# Patient Record
Sex: Female | Born: 1975 | Race: White | Hispanic: No | Smoking: Current every day smoker
Health system: Southern US, Community
[De-identification: ages and names within clinical notes are randomized; demographics above are authoritative.]

## PROBLEM LIST (undated history)

## (undated) DIAGNOSIS — D34 Benign neoplasm of thyroid gland: Secondary | ICD-10-CM

## (undated) HISTORY — PX: URETHRA SURGERY: SHX824

---

## 2014-12-30 ENCOUNTER — Encounter (HOSPITAL_COMMUNITY): Payer: Self-pay | Admitting: Nurse Practitioner

## 2014-12-30 ENCOUNTER — Emergency Department (HOSPITAL_COMMUNITY)
Admission: EM | Admit: 2014-12-30 | Discharge: 2014-12-30 | Disposition: A | Discharge status: Home / Self Care | Payer: Self-pay | Attending: Emergency Medicine | Admitting: Emergency Medicine

## 2014-12-30 DIAGNOSIS — Z720 Tobacco use: Secondary | ICD-10-CM | POA: Insufficient documentation

## 2014-12-30 DIAGNOSIS — Z3202 Encounter for pregnancy test, result negative: Secondary | ICD-10-CM | POA: Insufficient documentation

## 2014-12-30 DIAGNOSIS — R197 Diarrhea, unspecified: Secondary | ICD-10-CM | POA: Insufficient documentation

## 2014-12-30 DIAGNOSIS — Z880 Allergy status to penicillin: Secondary | ICD-10-CM | POA: Insufficient documentation

## 2014-12-30 DIAGNOSIS — R1084 Generalized abdominal pain: Secondary | ICD-10-CM | POA: Insufficient documentation

## 2014-12-30 DIAGNOSIS — R1110 Vomiting, unspecified: Secondary | ICD-10-CM

## 2014-12-30 HISTORY — DX: Benign neoplasm of thyroid gland: D34

## 2014-12-30 LAB — URINALYSIS, ROUTINE W REFLEX MICROSCOPIC
GLUCOSE, UA: NEGATIVE mg/dL
HGB URINE DIPSTICK: NEGATIVE
Ketones, ur: 15 mg/dL — AB
Nitrite: NEGATIVE
PH: 5 (ref 5.0–8.0)
Protein, ur: NEGATIVE mg/dL
Specific Gravity, Urine: 1.021 (ref 1.005–1.030)
Urobilinogen, UA: 0.2 mg/dL (ref 0.0–1.0)

## 2014-12-30 LAB — COMPREHENSIVE METABOLIC PANEL
ALT: 26 U/L (ref 14–54)
ANION GAP: 7 (ref 5–15)
AST: 29 U/L (ref 15–41)
Albumin: 3.3 g/dL — ABNORMAL LOW (ref 3.5–5.0)
Alkaline Phosphatase: 87 U/L (ref 38–126)
CO2: 24 mmol/L (ref 22–32)
Calcium: 9.2 mg/dL (ref 8.9–10.3)
Chloride: 106 mmol/L (ref 101–111)
Creatinine, Ser: 0.67 mg/dL (ref 0.44–1.00)
GFR calc Af Amer: >60 mL/min (ref >60)
GFR calc non Af Amer: >60 mL/min (ref >60)
Glucose, Bld: 83 mg/dL (ref 65–99)
Potassium: 4.5 mmol/L (ref 3.5–5.1)
Sodium: 137 mmol/L (ref 135–145)
TOTAL PROTEIN: 7.6 g/dL (ref 6.5–8.1)
Total Bilirubin: 0.3 mg/dL (ref 0.3–1.2)

## 2014-12-30 LAB — POC URINE PREG, ED: Preg Test, Ur: NEGATIVE

## 2014-12-30 LAB — CBC WITH DIFFERENTIAL/PLATELET
Basophils Absolute: 0 10*3/uL (ref 0.0–0.1)
Basophils Relative: 0 % (ref 0–1)
EOS ABS: 0.2 10*3/uL (ref 0.0–0.7)
Eosinophils Relative: 2 % (ref 0–5)
HCT: 46.5 % — ABNORMAL HIGH (ref 36.0–46.0)
Hemoglobin: 16.3 g/dL — ABNORMAL HIGH (ref 12.0–15.0)
LYMPHS ABS: 3 10*3/uL (ref 0.7–4.0)
LYMPHS PCT: 31 % (ref 12–46)
MCH: 29.7 pg (ref 26.0–34.0)
MCHC: 35.1 g/dL (ref 30.0–36.0)
MCV: 84.9 fL (ref 78.0–100.0)
Monocytes Absolute: 0.5 10*3/uL (ref 0.1–1.0)
Monocytes Relative: 5 % (ref 3–12)
NEUTROS PCT: 62 % (ref 43–77)
Neutro Abs: 6.2 10*3/uL (ref 1.7–7.7)
PLATELETS: 252 10*3/uL (ref 150–400)
RBC: 5.48 MIL/uL — AB (ref 3.87–5.11)
RDW: 14.7 % (ref 11.5–15.5)
WBC: 9.9 10*3/uL (ref 4.0–10.5)

## 2014-12-30 LAB — URINE MICROSCOPIC-ADD ON

## 2014-12-30 MED ORDER — ONDANSETRON HCL 4 MG/2ML IJ SOLN
4.0000 mg | Freq: Once | INTRAMUSCULAR | Status: AC
  Administered 2014-12-30: 4 mg via INTRAVENOUS
  Filled 2014-12-30: qty 2

## 2014-12-30 MED ORDER — METOCLOPRAMIDE HCL 10 MG PO TABS: 10.0000 mg | ORAL_TABLET | Freq: Four times a day (QID) | ORAL | Status: DC | PRN

## 2014-12-30 MED ORDER — SODIUM CHLORIDE 0.9 % IV BOLUS (SEPSIS)
1000.0000 mL | Freq: Once | INTRAVENOUS | Status: AC
  Administered 2014-12-30: 1000 mL via INTRAVENOUS

## 2014-12-30 NOTE — ED Provider Notes (Signed)
CSN: [id number]     Arrival date & time 12/30/14  1224 History   First MD Initiated Contact with Patient 12/30/14 1241     Chief Complaint  Patient presents with  . Emesis     (Consider location/radiation/quality/duration/timing/severity/associated sxs/prior Treatment) Patient is a 39 y.o. female presenting with abdominal pain.  Abdominal Pain Pain location:  Generalized Pain quality: cramping   Pain radiates to:  Does not radiate Pain severity:  No pain Onset quality:  Gradual Duration:  1 day Timing:  Constant Progression:  Unchanged Chronicity:  New Context: not previous surgeries and not recent illness   Relieved by:  Nothing Worsened by:  Nothing tried Ineffective treatments:  None tried Associated symptoms: anorexia, diarrhea and vomiting   Associated symptoms: no dysuria, no hematemesis, no hematochezia, no vaginal bleeding and no vaginal discharge     Past Medical History  Diagnosis Date  . Thyroid tumor, benign    No past surgical history on file. No family history on file. History  Substance Use Topics  . Smoking status: Current Every Day Smoker    Types: Cigarettes  . Smokeless tobacco: Not on file  . Alcohol Use: No   OB History    No data available     Review of Systems  Gastrointestinal: Positive for vomiting, abdominal pain, diarrhea and anorexia. Negative for hematochezia and hematemesis.  Genitourinary: Negative for dysuria, vaginal bleeding and vaginal discharge.  All other systems reviewed and are negative.     Allergies  Penicillins  Home Medications   Prior to Admission medications   Medication Sig Start Date End Date Taking? Authorizing Provider  bismuth subsalicylate (PEPTO BISMOL) 262 MG/15ML suspension Take 30 mLs by mouth every 6 (six) hours as needed for indigestion.   Yes Historical Provider, MD  metoCLOPramide (REGLAN) 10 MG tablet Take 1 tablet (10 mg total) by mouth every 6 (six) hours as needed for nausea or vomiting.  12/30/14   Debby Freiberg, MD   BP 95/44 mmHg  Pulse 80  Temp(Src) 98.1 F (36.7 C) (Oral)  Resp 20  Ht 5\' 10"  (1.778 m)  Wt 234 lb (106.142 kg)  BMI 33.58 kg/m2  SpO2 92% Physical Exam  Constitutional: She is oriented to person, place, and time. She appears well-developed and well-nourished.  HENT:  Head: Normocephalic and atraumatic.  Right Ear: External ear normal.  Left Ear: External ear normal.  Eyes: Conjunctivae and EOM are normal. Pupils are equal, round, and reactive to light.  Neck: Normal range of motion. Neck supple.  Cardiovascular: Normal rate, regular rhythm, normal heart sounds and intact distal pulses.   Pulmonary/Chest: Effort normal and breath sounds normal.  Abdominal: Soft. Bowel sounds are normal. There is generalized tenderness.  Musculoskeletal: Normal range of motion.  Neurological: She is alert and oriented to person, place, and time.  Skin: Skin is warm and dry.  Vitals reviewed.   ED Course  Procedures (including critical care time) Labs Review Labs Reviewed  CBC WITH DIFFERENTIAL/PLATELET - Abnormal; Notable for the following:    RBC 5.48 (*)    Hemoglobin 16.3 (*)    HCT 46.5 (*)    All other components within normal limits  COMPREHENSIVE METABOLIC PANEL - Abnormal; Notable for the following:    BUN <5 (*)    Albumin 3.3 (*)    All other components within normal limits  URINALYSIS, ROUTINE W REFLEX MICROSCOPIC - Abnormal; Notable for the following:    Color, Urine AMBER (*)    APPearance CLOUDY (*)  Bilirubin Urine SMALL (*)    Ketones, ur 15 (*)    Leukocytes, UA SMALL (*)    All other components within normal limits  URINE MICROSCOPIC-ADD ON - Abnormal; Notable for the following:    Squamous Epithelial / LPF MANY (*)    Bacteria, UA MANY (*)    All other components within normal limits  POC URINE PREG, ED    Imaging Review No results found.   EKG Interpretation None      MDM   Final diagnoses:  Generalized abdominal  pain  Vomiting and diarrhea    39 y.o. female without pertinent PMH presents with abd pain, crampy, n/v/d.  Initial exam as above with very mild abd tenderness generally.  Given zofran, repeat abd exam unremarkable no tenderness.  NS bolus helped.    Wu unremarkable.  BP mildly hypotensive, per pt this is her baseline.  She feels much improved.  Standard return precautions given, pt to fu with PCP.  I have reviewed all laboratory and imaging studies if ordered as above  1. Generalized abdominal pain   2. Vomiting and diarrhea         Debby Freiberg, MD 01/01/15 [phone]

## 2014-12-30 NOTE — ED Notes (Signed)
Assisted patient to rest room. Collected urine sample.

## 2014-12-30 NOTE — ED Notes (Signed)
Pt c/o n/v/abd pain since Saturday. She states she can not tolerate any oral intake and is concerned for dehydration

## 2014-12-30 NOTE — ED Notes (Signed)
Per pt, she states her blood pressure usually runs low, reports systolic of 80 being her norm. MD aware. Pt appears fine and is alert and oriented x4, denies any complications at this time.

## 2014-12-30 NOTE — Discharge Instructions (Signed)

## 2016-03-10 ENCOUNTER — Emergency Department (HOSPITAL_COMMUNITY)
Admission: EM | Admit: 2016-03-10 | Discharge: 2016-03-10 | Disposition: A | Discharge status: Home / Self Care | Payer: Self-pay | Attending: Physician Assistant | Admitting: Physician Assistant

## 2016-03-10 ENCOUNTER — Emergency Department (HOSPITAL_COMMUNITY): Payer: Self-pay

## 2016-03-10 ENCOUNTER — Encounter (HOSPITAL_COMMUNITY): Payer: Self-pay | Admitting: Emergency Medicine

## 2016-03-10 DIAGNOSIS — Y92009 Unspecified place in unspecified non-institutional (private) residence as the place of occurrence of the external cause: Secondary | ICD-10-CM | POA: Insufficient documentation

## 2016-03-10 DIAGNOSIS — Y999 Unspecified external cause status: Secondary | ICD-10-CM | POA: Insufficient documentation

## 2016-03-10 DIAGNOSIS — Y939 Activity, unspecified: Secondary | ICD-10-CM | POA: Insufficient documentation

## 2016-03-10 DIAGNOSIS — F17210 Nicotine dependence, cigarettes, uncomplicated: Secondary | ICD-10-CM | POA: Insufficient documentation

## 2016-03-10 DIAGNOSIS — S0592XA Unspecified injury of left eye and orbit, initial encounter: Secondary | ICD-10-CM | POA: Insufficient documentation

## 2016-03-10 DIAGNOSIS — S0540XA Penetrating wound of orbit with or without foreign body, unspecified eye, initial encounter: Secondary | ICD-10-CM

## 2016-03-10 DIAGNOSIS — W228XXA Striking against or struck by other objects, initial encounter: Secondary | ICD-10-CM | POA: Insufficient documentation

## 2016-03-10 MED ORDER — TETRACAINE HCL 0.5 % OP SOLN
1.0000 [drp] | Freq: Once | OPHTHALMIC | Status: AC
  Administered 2016-03-10: 1 [drp] via OPHTHALMIC
  Filled 2016-03-10: qty 4

## 2016-03-10 MED ORDER — FLUORESCEIN SODIUM 1 MG OP STRP
1.0000 | ORAL_STRIP | Freq: Once | OPHTHALMIC | Status: AC
  Administered 2016-03-10: 1 via OPHTHALMIC
  Filled 2016-03-10: qty 1

## 2016-03-10 MED ORDER — OXYCODONE-ACETAMINOPHEN 5-325 MG PO TABS: 1.0000 | ORAL_TABLET | Freq: Once | ORAL | Status: DC

## 2016-03-10 MED ORDER — ONDANSETRON 4 MG PO TBDP
4.0000 mg | ORAL_TABLET | Freq: Once | ORAL | Status: AC
  Administered 2016-03-10: 4 mg via ORAL
  Filled 2016-03-10: qty 1

## 2016-03-10 MED ORDER — CIPROFLOXACIN HCL 0.3 % OP SOLN
2.0000 [drp] | Freq: Once | OPHTHALMIC | Status: AC
  Administered 2016-03-10: 2 [drp] via OPHTHALMIC
  Filled 2016-03-10: qty 2.5

## 2016-03-10 MED ORDER — IBUPROFEN 800 MG PO TABS
800.0000 mg | ORAL_TABLET | Freq: Once | ORAL | Status: AC
  Administered 2016-03-10: 800 mg via ORAL
  Filled 2016-03-10: qty 1

## 2016-03-10 NOTE — Discharge Instructions (Signed)
Please use cipro drops every 4 hours while awake and follow up with eye doctor tomorrow.  Do not put contacts on and if you have any visual changes, increase in pain please return to ED.

## 2016-03-10 NOTE — ED Notes (Signed)
I awaken her from a sound sleep to perform a 1 liter nss morgan lens instillation of left eye. Pt. Tol. Quite well.

## 2016-03-10 NOTE — Progress Notes (Signed)
EDCM spoke to patient at bedside. Patient confirms she does not have a pcp or insurance living in [place].  [facility] provided patient with contact information to [facility], informed patient of services there and walk in times.  EDCM also provided patient with list of pcps who accept self pay patients, list of discount pharmacies and websites needymeds.org and GoodRX.com for medication assistance, phone number to inquire about the orange card, phone number to inquire about Medicaid, phone number to inquire about the [place], financial resources in the community such as local churches, salvation army, urban ministries, and dental assistance for uninsured patients.  Patient thankful for resources.  No further EDCM needs at this time.

## 2016-03-10 NOTE — ED Notes (Signed)
She has been soundly sleeping ever since eye irrigation.

## 2016-03-10 NOTE — ED Triage Notes (Signed)
Patient was working 3rd shift and got hit in left eye with something.  Patient states that now sensitive to light and causing headache.

## 2016-03-10 NOTE — ED Provider Notes (Signed)
[facility] DEPT Provider Note   CSN: [id number] Arrival date & time: 03/10/16  1045  First Provider Contact:  First MD Initiated Contact with Patient 03/10/16 1358        History   Chief Complaint Chief Complaint  Patient presents with  . Eye Injury  . Migraine    HPI Rebecca French is a 40 y.o. female.  HPI   Patient is a 41 year old female presenting with pain to her left eye. Patient was driving home at 4 AM and felt something fly in the window. She is concerned it might be metal because it stung and hurt a lot. Patient now has a headache due to the pain in her eye. Patient is photosensitive. She is unable to open left eye without significant pain.  Past Medical History:  Diagnosis Date  . Thyroid tumor, benign     There are no active problems to display for this patient.   History reviewed. No pertinent surgical history.  OB History    No data available       Home Medications    Prior to Admission medications   Not on File    Family History No family history on file.  Social History Social History  Substance Use Topics  . Smoking status: Current Every Day Smoker    Types: Cigarettes  . Smokeless tobacco: Never Used  . Alcohol use No     Allergies   Penicillins   Review of Systems Review of Systems  Constitutional: Negative for fever.  Eyes: Positive for photophobia, pain, redness and visual disturbance.  Gastrointestinal: Negative for vomiting.  Skin: Negative for color change and rash.  Neurological: Positive for headaches.  All other systems reviewed and are negative.    Physical Exam Updated Vital Signs BP 123/79 (BP Location: Left Arm)   Pulse 76   Temp 98.3 F (36.8 C) (Oral)   Resp 18   LMP 02/25/2016   SpO2 98%   Physical Exam  Constitutional: She is oriented to person, place, and time. She appears well-developed and well-nourished.  HENT:  Head: Normocephalic and atraumatic.  Eyes: Right eye exhibits no discharge.   L eye injected swelling to the eye lid, EOMI, but with pain.  Pupils equal reaactive  Cardiovascular: Normal rate, regular rhythm and normal heart sounds.   No murmur heard. Pulmonary/Chest: Effort normal and breath sounds normal. She has no wheezes. She has no rales.  Abdominal: Soft. She exhibits no distension. There is no tenderness.  Neurological: She is oriented to person, place, and time.  Skin: Skin is warm and dry. She is not diaphoretic.  Psychiatric: She has a normal mood and affect.  Nursing note and vitals reviewed.    ED Treatments / Results  Labs (all labs ordered are listed, but only abnormal results are displayed) Labs Reviewed - No data to display  EKG  EKG Interpretation None       Radiology Ct Orbits Wo Contrast  Result Date: 03/10/2016 CLINICAL DATA:  The patient was struck in the left eye last night with a unknown object. Pain, light sensitivity and headache. Initial encounter. EXAM: CT ORBITS WITHOUT CONTRAST TECHNIQUE: Multidetector CT imaging of the orbits was performed following the standard protocol without intravenous contrast. COMPARISON:  None. FINDINGS: The globes are intact and the lenses are located bilaterally. Orbital fat is clear. Extra-ocular muscles and optic nerves appear normal. There is no fracture. Mild mucosal thickening left maxillary sinus is identified and there is mild ethmoid air cell disease.  Left mastoid effusion is noted and there is a small erosion in the tegmen. Soft tissue attenuating material is seen about the auditory ossicles on the left. Imaged intracranial contents appear normal. IMPRESSION: Normal appearing orbits. Left mastoid effusion with erosion in the tegmen and middle ear inflammatory change. Nonemergent consultation with ENT is recommended. Electronically Signed   By: Inge Rise M.D.   On: 03/10/2016 17:29   Procedures Procedures (including critical care time)  Medications Ordered in ED Medications    oxyCODONE-acetaminophen (PERCOCET/ROXICET) 5-325 MG per tablet 1 tablet (1 tablet Oral Not Given 03/10/16 1825)  ondansetron (ZOFRAN-ODT) disintegrating tablet 4 mg (4 mg Oral Given 03/10/16 1425)  tetracaine (PONTOCAINE) 0.5 % ophthalmic solution 1 drop (1 drop Left Eye Given by Other 03/10/16 1425)  fluorescein ophthalmic strip 1 strip (1 strip Left Eye Given by Other 03/10/16 1425)  ibuprofen (ADVIL,MOTRIN) tablet 800 mg (800 mg Oral Given 03/10/16 1425)  ciprofloxacin (CILOXAN) 0.3 % ophthalmic solution 2 drop (2 drops Left Eye Given 03/10/16 1827)     Initial Impression / Assessment and Plan / ED Course  I have reviewed the triage vital signs and the nursing notes.  Pertinent labs & imaging results that were available during my care of the patient were reviewed by me and considered in my medical decision making (see chart for details).  Clinical Course  Value Comment By Time   3:07 PM Eye exam done, ? Small foreign body. Patietn unable to tolerate exam very well. Will irrigate, get CT of orbitz for foreign body Erna Brossard Julio Alm, MD 07/26 1507  CT ORBITS WO CONTRAST (Reviewed) Wilmetta Speiser Julio Alm, MD 07/26 1508  CT ORBITS WO CONTRAST (Reviewed) Taia Bramlett Julio Alm, MD 07/26 1508    Patient is a 40 year old female presenting with left eye pain. Patient felt something fly into her left eye. This happened at 4 AM she's had increasing pain since then. Patient unable to open left eye without significant pain. She is sensitive to light. Patient concerned that it could be something metal. We will get CT of orbits. We will do a lamp exam.  Lamp showed no corneal abrasion. Slit lamp showwed? Small particle but patietn had trouble tolerating. CT done= no metal present. Lens morgan placed- after 1 L patient felt much improved, able to open eye easily. Less pain.  Particle I saw on earlier exam gone.  Will give cipro drops (contact user) and have patietn follow up with ophtho.   Final Clinical  Impressions(s) / ED Diagnoses   Final diagnoses:  Foreign body behind the eye    New Prescriptions New Prescriptions   No medications on file     Loreley Schwall Julio Alm, MD 03/10/16 1900

## 2017-02-18 ENCOUNTER — Emergency Department (HOSPITAL_COMMUNITY)
Admission: EM | Admit: 2017-02-18 | Discharge: 2017-02-18 | Disposition: A | Discharge status: Home / Self Care | Payer: Self-pay | Attending: Emergency Medicine | Admitting: Emergency Medicine

## 2017-02-18 ENCOUNTER — Emergency Department (HOSPITAL_COMMUNITY): Payer: Self-pay

## 2017-02-18 ENCOUNTER — Encounter (HOSPITAL_COMMUNITY): Payer: Self-pay

## 2017-02-18 DIAGNOSIS — K760 Fatty (change of) liver, not elsewhere classified: Secondary | ICD-10-CM | POA: Insufficient documentation

## 2017-02-18 DIAGNOSIS — R112 Nausea with vomiting, unspecified: Secondary | ICD-10-CM

## 2017-02-18 DIAGNOSIS — F17210 Nicotine dependence, cigarettes, uncomplicated: Secondary | ICD-10-CM | POA: Insufficient documentation

## 2017-02-18 DIAGNOSIS — R1011 Right upper quadrant pain: Secondary | ICD-10-CM

## 2017-02-18 LAB — MAGNESIUM: Magnesium: 1.9 mg/dL (ref 1.7–2.4)

## 2017-02-18 LAB — URINALYSIS, ROUTINE W REFLEX MICROSCOPIC
BILIRUBIN URINE: NEGATIVE
Glucose, UA: NEGATIVE mg/dL
Hgb urine dipstick: NEGATIVE
KETONES UR: NEGATIVE mg/dL
Leukocytes, UA: NEGATIVE
NITRITE: NEGATIVE
Protein, ur: NEGATIVE mg/dL
Specific Gravity, Urine: 1.003 — ABNORMAL LOW (ref 1.005–1.030)
pH: 6 (ref 5.0–8.0)

## 2017-02-18 LAB — CBC
HEMATOCRIT: 43.6 % (ref 36.0–46.0)
Hemoglobin: 15.6 g/dL — ABNORMAL HIGH (ref 12.0–15.0)
MCH: 30.3 pg (ref 26.0–34.0)
MCHC: 35.8 g/dL (ref 30.0–36.0)
MCV: 84.7 fL (ref 78.0–100.0)
PLATELETS: 268 10*3/uL (ref 150–400)
RBC: 5.15 MIL/uL — ABNORMAL HIGH (ref 3.87–5.11)
RDW: 14.2 % (ref 11.5–15.5)
WBC: 8.9 10*3/uL (ref 4.0–10.5)

## 2017-02-18 LAB — COMPREHENSIVE METABOLIC PANEL
ALK PHOS: 88 U/L (ref 38–126)
ALT: 18 U/L (ref 14–54)
AST: 25 U/L (ref 15–41)
Albumin: 3.6 g/dL (ref 3.5–5.0)
Anion gap: 8 (ref 5–15)
BILIRUBIN TOTAL: 0.6 mg/dL (ref 0.3–1.2)
BUN: 8 mg/dL (ref 6–20)
CALCIUM: 9 mg/dL (ref 8.9–10.3)
CO2: 27 mmol/L (ref 22–32)
Chloride: 101 mmol/L (ref 101–111)
Creatinine, Ser: 0.68 mg/dL (ref 0.44–1.00)
GFR calc Af Amer: >60 mL/min (ref >60)
GFR calc non Af Amer: >60 mL/min (ref >60)
Glucose, Bld: 70 mg/dL (ref 65–99)
Potassium: 3.4 mmol/L — ABNORMAL LOW (ref 3.5–5.1)
Sodium: 136 mmol/L (ref 135–145)
TOTAL PROTEIN: 8.4 g/dL — AB (ref 6.5–8.1)

## 2017-02-18 LAB — TSH: TSH: 0.036 u[IU]/mL — ABNORMAL LOW (ref 0.350–4.500)

## 2017-02-18 LAB — POC URINE PREG, ED: PREG TEST UR: NEGATIVE

## 2017-02-18 LAB — LIPASE, BLOOD: Lipase: 17 U/L (ref 11–51)

## 2017-02-18 LAB — CK: Total CK: 35 U/L — ABNORMAL LOW (ref 38–234)

## 2017-02-18 MED ORDER — ONDANSETRON 4 MG PO TBDP
4.0000 mg | ORAL_TABLET | Freq: Once | ORAL | Status: AC | PRN
  Administered 2017-02-18: 4 mg via ORAL
  Filled 2017-02-18: qty 1

## 2017-02-18 MED ORDER — SODIUM CHLORIDE 0.9 % IV BOLUS (SEPSIS)
1000.0000 mL | Freq: Once | INTRAVENOUS | Status: AC
  Administered 2017-02-18: 1000 mL via INTRAVENOUS

## 2017-02-18 MED ORDER — POTASSIUM CHLORIDE CRYS ER 10 MEQ PO TBCR
10.0000 meq | EXTENDED_RELEASE_TABLET | Freq: Two times a day (BID) | ORAL | Status: DC
  Administered 2017-02-18: 10 meq via ORAL
  Filled 2017-02-18: qty 1

## 2017-02-18 NOTE — ED Triage Notes (Signed)
Per EMS, patient has been outside a lot the past couple days. 3 hours ago patient became dizzy, nauseous and was vomiting. Patient seemed to get better when being in the back of the ambulance due to the a/c. EMS reported that patient hasn't eaten anything in 2 days.

## 2017-02-18 NOTE — Discharge Instructions (Signed)
Please stay hydrated and in a cool dry climate.   Abdominal Pain  Your exam might not show the exact reason you have abdominal pain. Since there are many different causes of abdominal pain, another checkup and more tests may be needed. It is very important to follow up for lasting (persistent) or worsening symptoms. A possible cause of abdominal pain in any person who still has his or her appendix is acute appendicitis. Appendicitis is often hard to diagnose. Normal blood tests, urine tests, ultrasound, and CT scans do not completely rule out early appendicitis or other causes of abdominal pain. Sometimes, only the changes that happen over time will allow appendicitis and other causes of abdominal pain to be determined. Other potential problems that may require surgery may also take time to become more apparent. Because of this, it is important that you follow all of the instructions below.   HOME CARE INSTRUCTIONS  Do not take laxatives unless directed by your caregiver. Rest as much as possible.  Do not eat solid food until your pain is gone: A diet of water, weak decaffeinated tea, broth or bouillon, gelatin, oral rehydration solutions (ORS), frozen ice pops, or ice chips may be helpful.  When pain is gone: Start a light diet (dry toast, crackers, applesauce, or white rice). Increase the diet slowly as long as it does not bother you. Eat no dairy products (including cheese and eggs) and no spicy, fatty, fried, or high-fiber foods.  Use no alcohol, caffeine, or cigarettes.  Take your regular medicines unless your caregiver told you not to.  Take any prescribed medicine as directed.   SEEK IMMEDIATE MEDICAL CARE IF:  The pain does not go away.  You have a fever >101 that does not go down with medication. You keep throwing up (vomiting) or cannot drink liquids.  You have shaking chills (rigors) The pain becomes localized (Pain in the right side could possibly be appendicitis. In an adult, pain in  the left lower portion of the abdomen could be colitis or diverticulitis). You pass bloody or black tarry stools.  There is bright red blood in the stool.  There is blood in your vomit. Your bowel movements stop (become blocked) or you cannot pass gas.  The constipation stays for more than 4 days.  You have bloody, frequent, or painful urination.  You have yellow discoloration in the skin or whites of the eyes.  Your stomach becomes bloated or bigger.  You have dizziness or fainting.  You have chest or back pain. You have rectal pain.  You do not seem to be getting better.  You have any questions or concerns.   Please follow up with the wellness center this week for persistent symptoms. Please call and schedule an appointment with GI in regards to your Ultrasound findings. If you develop worsening or new concerning symptoms you can return to the emergency department for re-evaluation.   Establish relationship with primary care doctor as discussed. A resource guide and information on the Affordable Care Act has been provided for your information.    RESOURCE GUIDE  If you do not have a primary care doctor to follow up with regarding today's visit, please call the [facility] at [phone] to make an appointment. Hours of operation are 10am - 7pm, Monday through Friday, and they have a sliding scale fee.   Insufficient Money for Medicine: Contact [employer]:  call "211" or [place] [phone].  No Primary Care Doctor:  Call [facility]  [phone] - can help you locate a primary care doctor that  accepts your insurance, provides certain services, etc. Physician Referral Service- [phone]  Agencies that provide inexpensive medical care: [facility]  [facility]  [phone] [facility]  [phone] [facility]  [phone] [facility]  [phone] [facility]   [phone]  [place] Providers: [facility]- [address]  [phone], Mon-Fri 9am-7pm, Sat 9am-1pm [place], [address] [place], [address] [place]  [place]- [address]  [place], [address], [phone]  Only accepts [place] patients after they have their name  applied to their card  Self Pay (no insurance) in [facility]: Sickle Cell Patients: Dr Kevan Ny, [facility]  [place], [facility]- [place]  [facility] [place]- [address], [facility]- see information above (Speak to [employer] if you do not have insurance)       -  [facility]- [place], [place],  [place], [place]  Dr Vista Lawman-  [address], [place], [place] [facility]- [address], [phone]       -  [facility]- [address], [place], also [address] [phone]       -    [facility]- [address], [place], 1st & 3rd Saturday   every month, 10am-1pm  1) Find a Doctor and Pay Out of Pocket Although you won't have to find out who is covered by your insurance plan, it is a good idea to ask around and get recommendations. You will then need to call the office and see if the doctor you have chosen will accept you as a new patient and what types of options they offer for patients who are self-pay. Some doctors offer discounts or will set up payment plans for their patients who do not have insurance, but you will need to ask so you aren't surprised when  you get to your appointment.  2) Contact Your Local Health Department Not all health departments have doctors that can see patients for sick visits, but many do, so it is worth a call to see if yours does. If you don't know where your local health department is, you can check in your phone book. The CDC also has a tool to help you locate your state's health department, and many state websites also have listings of all of their local health departments.  3) Find a [facility] If your illness is not likely to be very severe or complicated, you may want to try a walk in clinic. These are popping up all over the country in pharmacies, drugstores, and shopping centers. They're usually staffed by nurse practitioners or physician assistants that  have been trained to treat common illnesses and complaints. They're usually fairly quick and inexpensive. However, if you have serious medical issues or chronic medical problems, these are probably not your best option

## 2017-02-18 NOTE — ED Provider Notes (Signed)
[facility] DEPT Provider Note   CSN: [id number] Arrival date & time: 02/18/17  1406     History   Chief Complaint Chief Complaint  Patient presents with  . Nausea  . Weakness  . Shortness of Breath  . Abdominal Pain    HPI Rebecca French is a 41 y.o. female with history of thyroid tumor who presents to the emergency department today by EMS for epigastric and right upper quadrant abdominal pain with associated nausea, vomiting, diarrhea, dizziness that began this morning. The patient states that she has been homeless for the last 3 days, "after leaving about situation". She says that she has been out in the heat and has been without food for 6 days. She states that she has tried to stay hydrated but has not drinking much. On awakening this morning the patient had nausea which was shortly followed by an episode of nonbilious, nonbloody emesis. The patient states that she has had 9 episodes of emesis since this morning. She is also having diarrhea. She states she has had 9 episodes of diarrhea since this morning as well. Patient notes that around 11 AM, she started experiencing a "crushing" pain in her abdomen, located in the epigastric and right upper quadrant area without radiation. This pain is constant and is worsened when the patient leans over. The patient states that prior to calling EMS earlier this afternoon she started having dizziness, which felt like she was swimming under water. This occurred whenever the patient would stand and would be relieved with rest. The patient states that when being transferred by EMS she started to feel better while being in the air-conditioned car. She states her symptoms have greatly improved since being in the emergency department and not being exposed to the sun. Patient is also complaining of dark-colored urine ([employer]) and increased urinary frequency (10 times per day). She denies vaginal pain or discharge. She denies associated fever, chills,  hematochezia, melena, lower leg swelling. The patient denies illicit drug use, alcohol use, any changes to medication. The patient LMP was last month, she states she is unsure if she is pregnant.   The patient does note that she has been more short of breath since yesterday. She also complains of a 45 minute episode of central and right-sided non-positional, nonpleuritic chest pain yesterday afternoon around 6 PM. There is no radiation to the jaw, neck, bilateral shoulders, back. She states that this is not brought on by anything, was not worsened with walking, and was relieved on its own. She has no recurrence of this since. Denies risk factors for DVT/PE including exogenous estrogen use, recent surgery or travel, trauma, immobilization, previous blood clot, hemoptysis, cancer, lower extremity pain or swelling, or family history of bleeding/clotting disorder. Patient is a 10 year pack history is a current smoker. She complains of an everyday cough that she has had for years, with no change. The patient's family history is significant for mother who had MI at the age of 53 and passed away from this.   HPI  Past Medical History:  Diagnosis Date  . Thyroid tumor, benign     There are no active problems to display for this patient.   Past Surgical History:  Procedure Laterality Date  . URETHRA SURGERY     "stretch of the urethria"    OB History    No data available       Home Medications    Prior to Admission medications   Not on File  Family History History reviewed. No pertinent family history.  Social History Social History  Substance Use Topics  . Smoking status: Current Every Day Smoker    Packs/day: 1.00    Types: Cigarettes  . Smokeless tobacco: Never Used  . Alcohol use No     Allergies   Penicillins   Review of Systems Review of Systems  All other systems reviewed and are negative.    Physical Exam Updated Vital Signs BP 102/68 (BP Location: Left  Arm)   Pulse 69   Temp 98 F (36.7 C) (Oral)   Resp 15   LMP 01/21/2017   SpO2 96%   Physical Exam  Constitutional: She appears well-developed and well-nourished.  HENT:  Head: Normocephalic and atraumatic.  Right Ear: External ear normal.  Left Ear: External ear normal.  Nose: Nose normal.  Mouth/Throat: Uvula is midline and oropharynx is clear and moist. Mucous membranes are dry.  Eyes: Conjunctivae and lids are normal. Pupils are equal, round, and reactive to light. Right eye exhibits no discharge. Left eye exhibits no discharge. No scleral icterus.  Neck: Trachea normal and normal range of motion. Neck supple. No spinous process tenderness present. Normal range of motion present.  Noted mass mass on the right lower neck system with the patient's thyroid mass.  Cardiovascular: Normal rate, regular rhythm and intact distal pulses.   No murmur heard. Pulses:      Radial pulses are 2+ on the right side, and 2+ on the left side.       Dorsalis pedis pulses are 2+ on the right side, and 2+ on the left side.       Posterior tibial pulses are 2+ on the right side, and 2+ on the left side.  No lower extremity swelling or edema.   Pulmonary/Chest: Effort normal and breath sounds normal. She exhibits tenderness (throughout).  Abdominal: Soft. Bowel sounds are normal. There is tenderness in the right upper quadrant. There is positive Murphy's sign. There is no rigidity, no rebound, no guarding and no CVA tenderness.  Musculoskeletal: She exhibits no edema.  Lymphadenopathy:    She has no cervical adenopathy.  Neurological: She is alert.  Speech clear. Follows commands. No facial droop. PERRLA. EOMI. Normal peripheral fields. CN III-XII intact.  Grossly moves all extremities 4 without ataxia. Coordination intact. Able and appropriate strength for age to upper and lower extremities bilaterally including grip strength. Sensation to light touch intact bilaterally for upper and lower. Patellar  deep tendon reflex 2+ and equal bilaterally. Normal finger to nose and rapid alternating movements. Normal heel to shin balance. Negative Romberg. No pronator drift. Normal gait.   Skin: Skin is warm and dry. No rash noted. She is not diaphoretic.  Psychiatric: She has a normal mood and affect.  Nursing note and vitals reviewed.    ED Treatments / Results  Labs (all labs ordered are listed, but only abnormal results are displayed) Labs Reviewed  COMPREHENSIVE METABOLIC PANEL - Abnormal; Notable for the following:       Result Value   Potassium 3.4 (*)    Total Protein 8.4 (*)    All other components within normal limits  CBC - Abnormal; Notable for the following:    RBC 5.15 (*)    Hemoglobin 15.6 (*)    All other components within normal limits  URINALYSIS, ROUTINE W REFLEX MICROSCOPIC - Abnormal; Notable for the following:    Specific Gravity, Urine 1.003 (*)    All other components within normal  limits  CK - Abnormal; Notable for the following:    Total CK 35 (*)    All other components within normal limits  TSH - Abnormal; Notable for the following:    TSH 0.036 (*)    All other components within normal limits  LIPASE, BLOOD  MAGNESIUM  POC URINE PREG, ED  POC URINE PREG, ED  CBG MONITORING, ED    EKG  EKG Interpretation  Date/Time:  Friday February 18 2017 16:34:12 EDT Ventricular Rate:  64 PR Interval:    QRS Duration: 103 QT Interval:  413 QTC Calculation: 427 R Axis:   71 Text Interpretation:  Sinus rhythm Consider left atrial enlargement no acute ST/T changes No old tracing to compare Confirmed by Sherwood Gambler [phone]) on 02/18/2017 5:37:56 PM       Radiology Dg Chest 2 View  Result Date: 02/18/2017 CLINICAL DATA:  Chest pain.  Abdominal pain. EXAM: CHEST  2 VIEW COMPARISON:  None. FINDINGS: The heart size and mediastinal contours are within normal limits. Both lungs are clear. The visualized skeletal structures are unremarkable. IMPRESSION: No active  cardiopulmonary disease. Electronically Signed   By: Fidela Salisbury M.D.   On: 02/18/2017 16:05   US Abdomen Limited Ruq  Result Date: 02/18/2017 CLINICAL DATA:  Right upper quadrant pain EXAM: ULTRASOUND ABDOMEN LIMITED RIGHT UPPER QUADRANT COMPARISON:  None. FINDINGS: Gallbladder: No gallstones or wall thickening visualized. There is no pericholecystic fluid. No sonographic Murphy sign noted by sonographer. Common bile duct: Diameter: 4 mm. There is no intrahepatic or extrahepatic biliary duct dilatation. Liver: No focal lesion identified.  Liver echogenicity is increased. IMPRESSION: Liver echogenicity increased, a finding most likely indicative of a degree of hepatic steatosis. While no focal liver lesions are evident, it must be cautioned that the sensitivity of ultrasound for detection of focal liver lesions is diminished in this circumstance. Study otherwise unremarkable. Electronically Signed   By: Lowella Grip III M.D.   On: 02/18/2017 16:42    Procedures Procedures (including critical care time)  Medications Ordered in ED Medications  ondansetron (ZOFRAN-ODT) disintegrating tablet 4 mg (4 mg Oral Given 02/18/17 1526)  sodium chloride 0.9 % bolus 1,000 mL (1,000 mLs Intravenous New Bag/Given 02/18/17 1604)     Initial Impression / Assessment and Plan / ED Course  I have reviewed the triage vital signs and the nursing notes.  Pertinent labs & imaging results that were available during my care of the patient were reviewed by me and considered in my medical decision making (see chart for details).     41 year old female presenting today for right upper quadrant and epigastric abdominal pain, nausea, vomiting, diarrhea after being out in the heat for the last 3 days and without food last 6 days. On presentation the patient is afebrile and nontoxic appearing. On exam the patient appears dehydrated and has right upper quadrant abdominal pain with positive Murphy sign. Will obtain  basic labs, Mg, CK, Lipase, RUQ [place] to evaluate for etiology. Will also check CBG. Patient endorses increase urinary frequency, and dark urine. She states there is a chance she is pregnant. Will order UA, urine preg. Patient had 45 minute bout of atypical chest pain that was non-exertional, non-positional, non-pleuritic yesterday. No excerebration factors, no associated nausea, diaphoresis, emesis at the time. Has not reoccurred. There is chest tenderness to palpation on exam but cardiopulmonary otherwise unremarkable. Will order EKG and CXR. IV fluid and Zofran given.  Discussed case with Dr. Regenia Skeeter who is in agreement with  plan.   Urine pregnancy negative. UA without UTI. No ketones or glucose in urine. Magnesium unremarkable. CK with no evidence of rhabdomyolysis. Lipase unremarkable for pancreatitis. CMP with potassium of 3.4. Will replace orally. CMP otherwise unremarkable. CBC without leukocytosis. No anemia. Chest x-ray with no active cardio pulmonary disease. Her RUQ ultrasound with probable hepatic steatosis. Otherwise unremarkable. No LFT changes. Discussed with patient US findings and fatty liver. Will refer her to GI for follow up of this.   I advised the patient to follow-up with their primary care provider this week. I advised the patient if she does not have a PCP that she can be [facility] as the patient's with out insurance. I also provided the patient a resource guide for primary care providers. I advised the patient to return to the emergency department with new or worsening symptoms or new concerns. Specific return precautions discussed. The patient verbalized understanding and agreement with plan. All questions answered. No further questions at this time. Patient appears safe for discharge.    Final Clinical Impressions(s) / ED Diagnoses   Final diagnoses:  RUQ pain  Non-intractable vomiting with nausea, unspecified vomiting type  Hepatic steatosis    New  Prescriptions New Prescriptions   No medications on file     Lorelle Gibbs 02/18/17 Dallie Dad, MD 02/20/17 [phone]

## 2017-02-18 NOTE — ED Triage Notes (Signed)
Patient reports she is currently homeless and in between homes.
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
## 2018-10-24 IMAGING — CR DG CHEST 2V
2 series · 2 of 2 positions shown · non-contrast
Comparison: None.

CLINICAL DATA: Chest pain.  Abdominal pain.

EXAM:
CHEST  2 VIEW

[w chest pa]
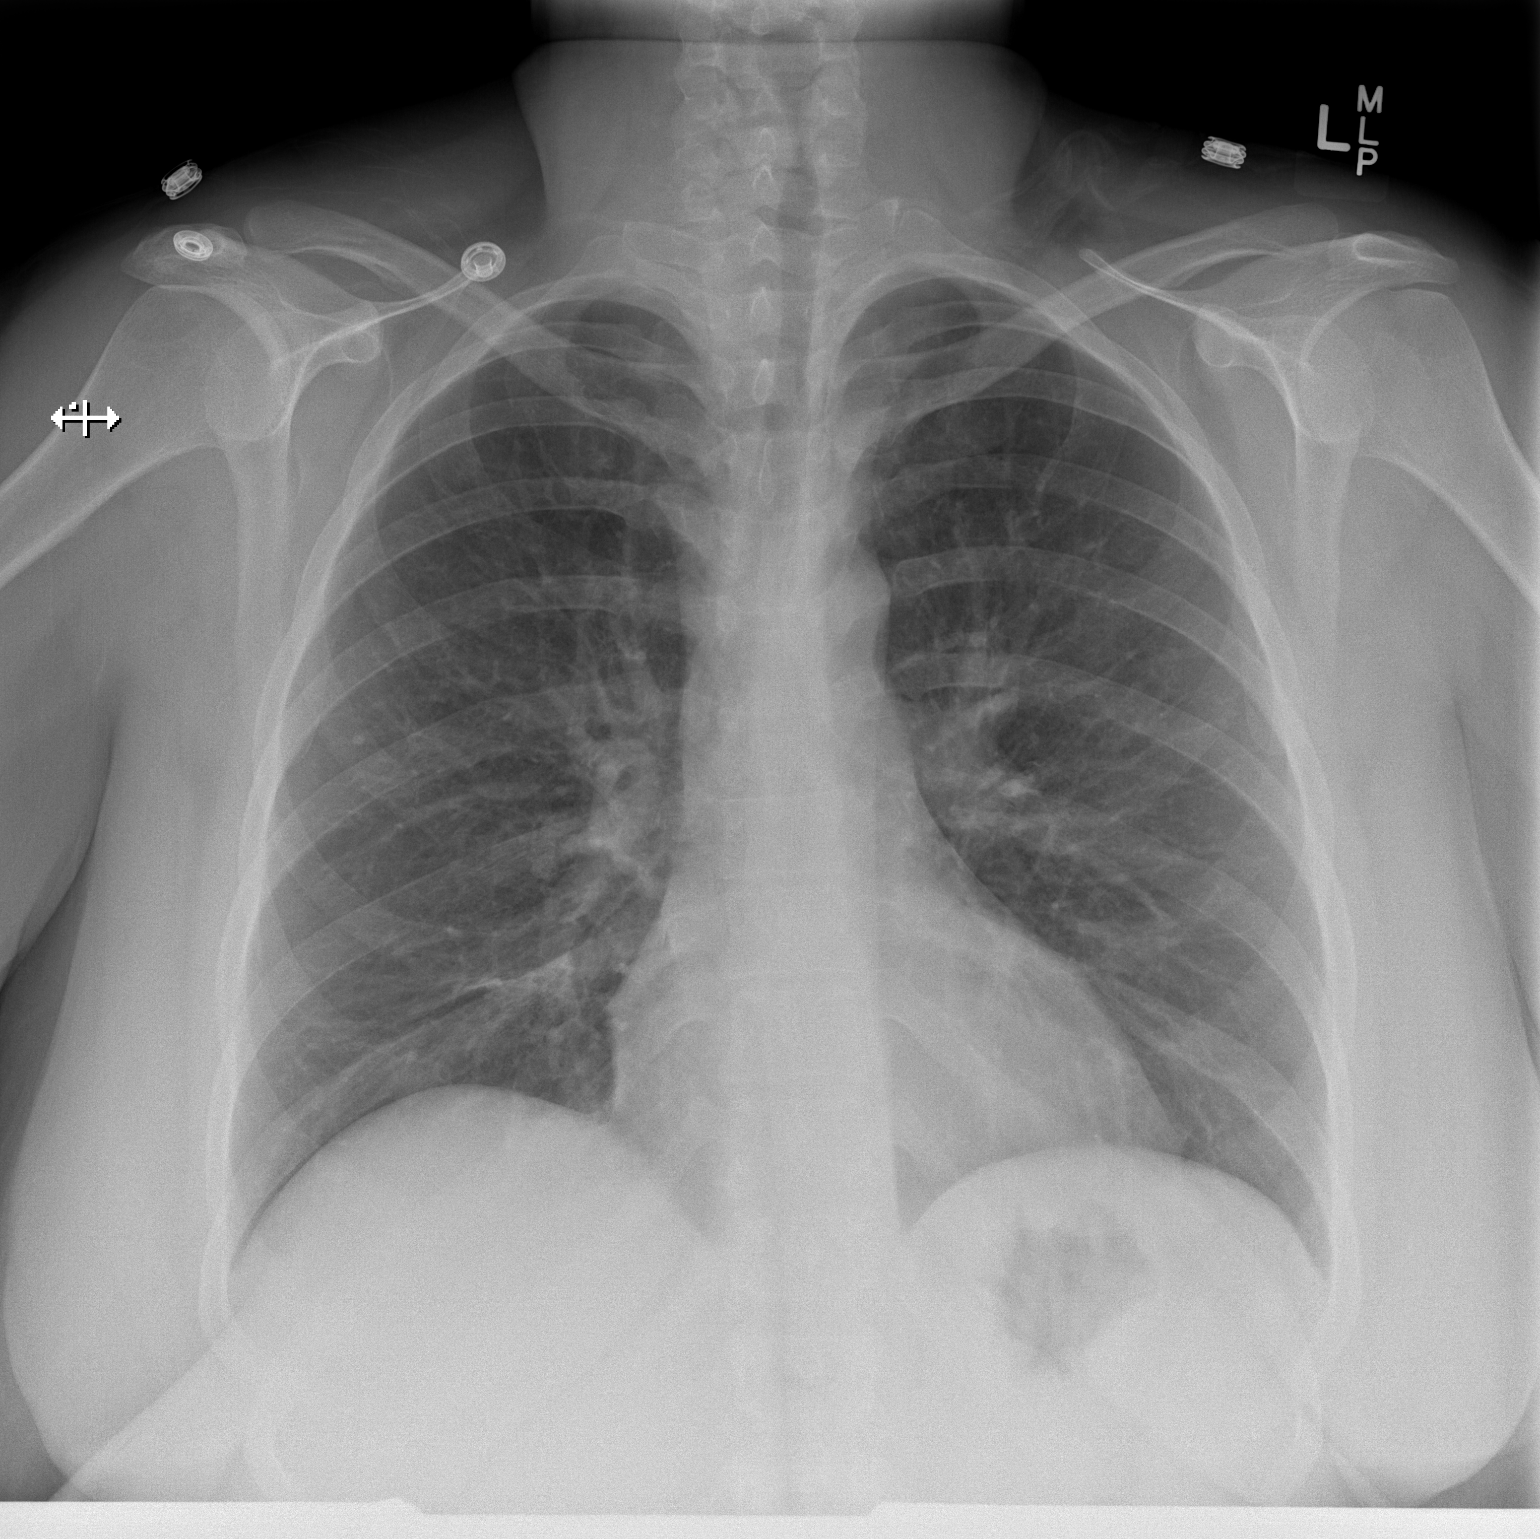

[w chest lat]
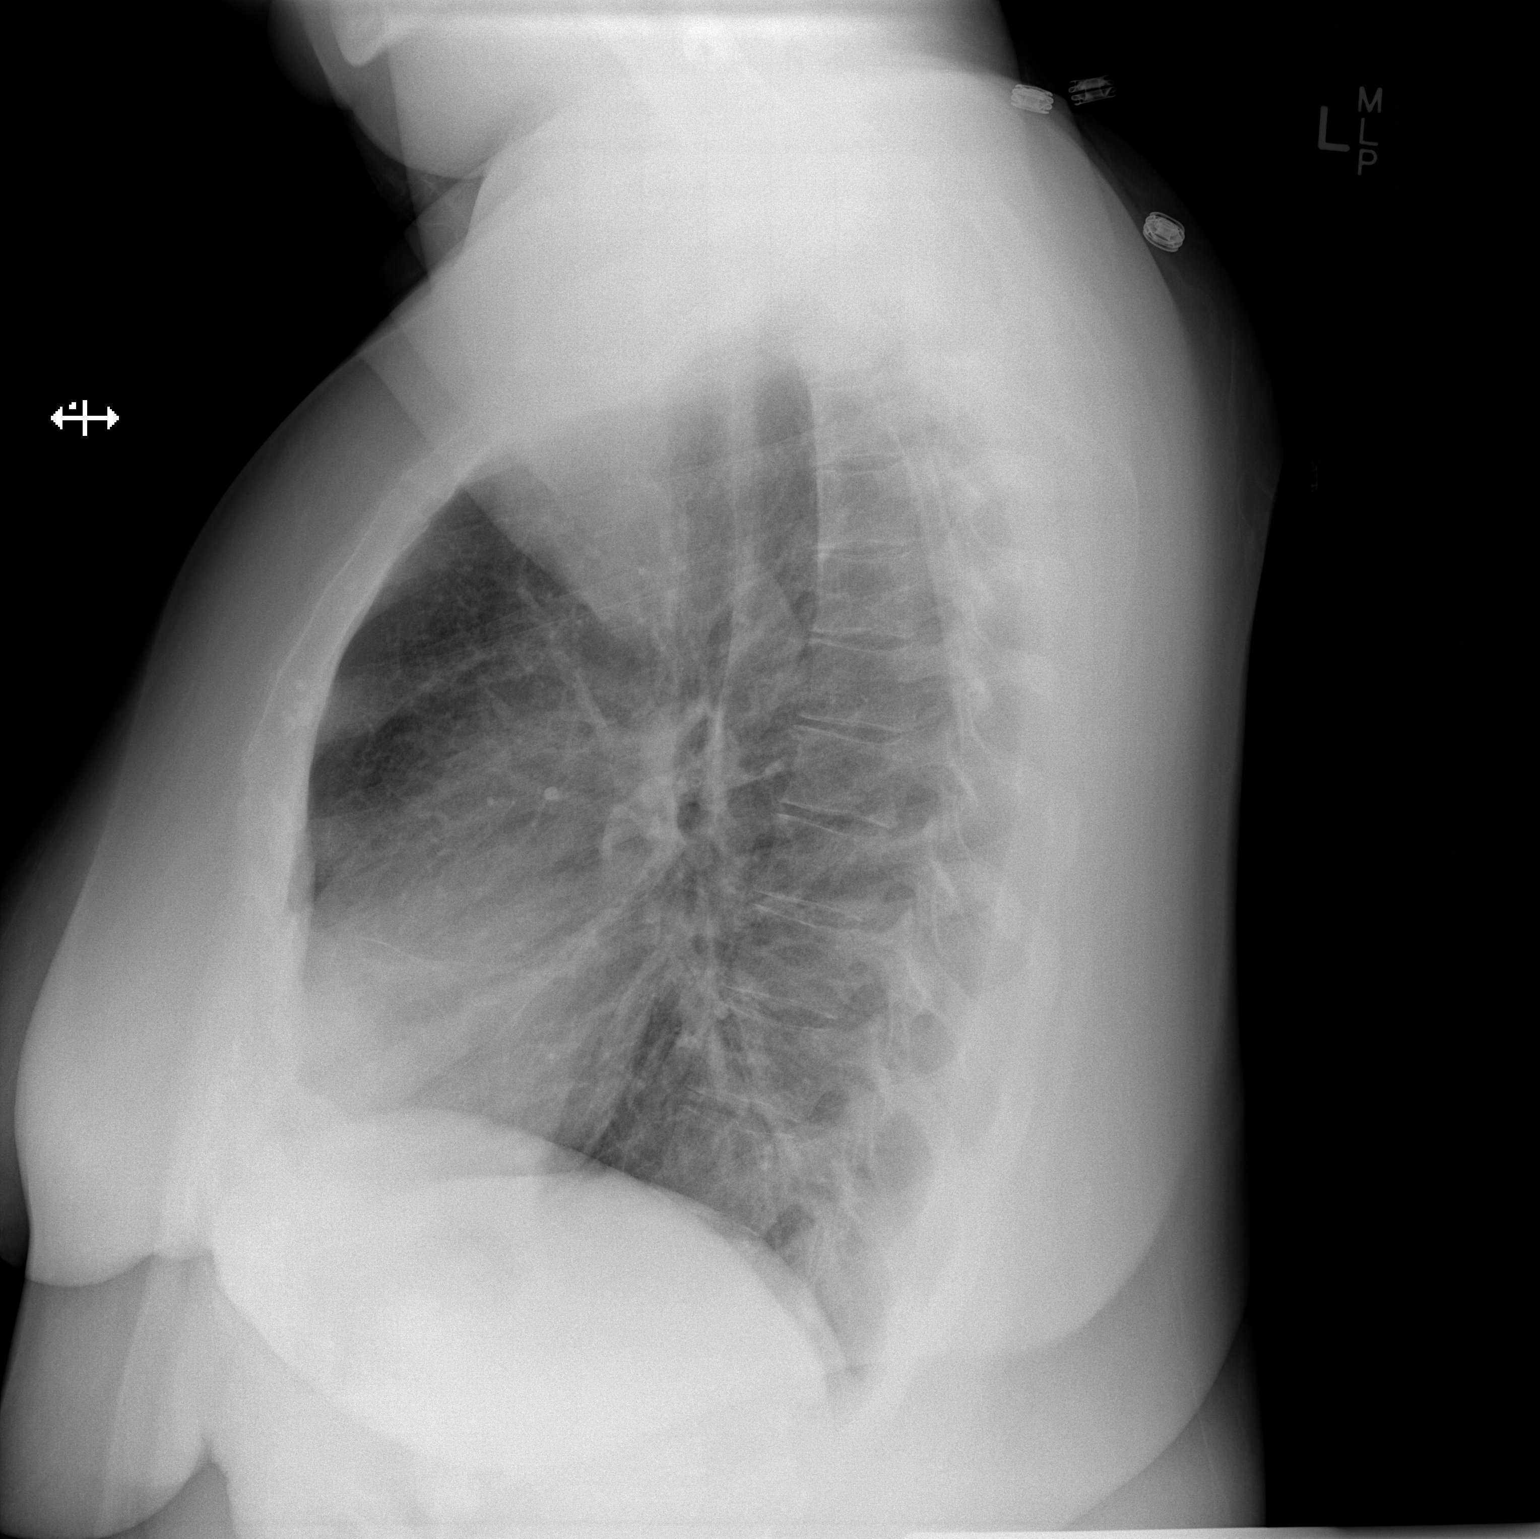

[2 of 2 positions shown; findings below may reference images not displayed]

FINDINGS: The heart size and mediastinal contours are within normal limits.
Both lungs are clear. The visualized skeletal structures are
unremarkable.
IMPRESSION: No active cardiopulmonary disease.
